# Patient Record
Sex: Male | Born: 1965 | Race: Black or African American | Hispanic: No | Smoking: Never smoker
Health system: Southern US, Community
[De-identification: ages and names within clinical notes are randomized; demographics above are authoritative.]

## PROBLEM LIST (undated history)

## (undated) DIAGNOSIS — I10 Essential (primary) hypertension: Secondary | ICD-10-CM

## (undated) HISTORY — PX: CHOLECYSTECTOMY: SHX55

## (undated) HISTORY — PX: APPENDECTOMY: SHX54

---

## 2012-07-06 DIAGNOSIS — R109 Unspecified abdominal pain: Secondary | ICD-10-CM

## 2018-01-29 ENCOUNTER — Emergency Department (HOSPITAL_COMMUNITY): Payer: Managed Care, Other (non HMO)

## 2018-01-29 ENCOUNTER — Encounter (HOSPITAL_COMMUNITY): Payer: Self-pay | Admitting: Emergency Medicine

## 2018-01-29 ENCOUNTER — Other Ambulatory Visit: Payer: Self-pay

## 2018-01-29 ENCOUNTER — Emergency Department (HOSPITAL_COMMUNITY)
Admission: EM | Admit: 2018-01-29 | Discharge: 2018-01-29 | Disposition: A | Discharge status: Home / Self Care | Payer: Managed Care, Other (non HMO) | Attending: Emergency Medicine | Admitting: Emergency Medicine

## 2018-01-29 DIAGNOSIS — I10 Essential (primary) hypertension: Secondary | ICD-10-CM | POA: Insufficient documentation

## 2018-01-29 DIAGNOSIS — S46001A Unspecified injury of muscle(s) and tendon(s) of the rotator cuff of right shoulder, initial encounter: Secondary | ICD-10-CM

## 2018-01-29 DIAGNOSIS — Y92410 Unspecified street and highway as the place of occurrence of the external cause: Secondary | ICD-10-CM | POA: Insufficient documentation

## 2018-01-29 DIAGNOSIS — R109 Unspecified abdominal pain: Secondary | ICD-10-CM | POA: Insufficient documentation

## 2018-01-29 DIAGNOSIS — R0602 Shortness of breath: Secondary | ICD-10-CM | POA: Insufficient documentation

## 2018-01-29 DIAGNOSIS — S20219A Contusion of unspecified front wall of thorax, initial encounter: Secondary | ICD-10-CM | POA: Insufficient documentation

## 2018-01-29 DIAGNOSIS — Z79899 Other long term (current) drug therapy: Secondary | ICD-10-CM | POA: Insufficient documentation

## 2018-01-29 DIAGNOSIS — R079 Chest pain, unspecified: Secondary | ICD-10-CM | POA: Diagnosis present

## 2018-01-29 DIAGNOSIS — S20211A Contusion of right front wall of thorax, initial encounter: Secondary | ICD-10-CM

## 2018-01-29 DIAGNOSIS — Y9389 Activity, other specified: Secondary | ICD-10-CM | POA: Diagnosis not present

## 2018-01-29 DIAGNOSIS — Y999 Unspecified external cause status: Secondary | ICD-10-CM | POA: Diagnosis not present

## 2018-01-29 HISTORY — DX: Essential (primary) hypertension: I10

## 2018-01-29 LAB — TYPE AND SCREEN
ABO/RH(D): B POS
ANTIBODY SCREEN: NEGATIVE

## 2018-01-29 LAB — COMPREHENSIVE METABOLIC PANEL
ALBUMIN: 4.1 g/dL (ref 3.5–5.0)
ALK PHOS: 70 U/L (ref 38–126)
ALT: 44 U/L (ref 17–63)
ANION GAP: 8 (ref 5–15)
AST: 41 U/L (ref 15–41)
BILIRUBIN TOTAL: 1 mg/dL (ref 0.3–1.2)
BUN: 14 mg/dL (ref 6–20)
CALCIUM: 9.3 mg/dL (ref 8.9–10.3)
CO2: 23 mmol/L (ref 22–32)
CREATININE: 1.18 mg/dL (ref 0.61–1.24)
Chloride: 109 mmol/L (ref 101–111)
GFR calc Af Amer: >60 mL/min (ref >60)
GFR calc non Af Amer: >60 mL/min (ref >60)
GLUCOSE: 114 mg/dL — AB (ref 65–99)
Potassium: 3.8 mmol/L (ref 3.5–5.1)
SODIUM: 140 mmol/L (ref 135–145)
TOTAL PROTEIN: 7.1 g/dL (ref 6.5–8.1)

## 2018-01-29 LAB — CBC
HCT: 45.2 % (ref 39.0–52.0)
HEMOGLOBIN: 14.8 g/dL (ref 13.0–17.0)
MCH: 29.5 pg (ref 26.0–34.0)
MCHC: 32.7 g/dL (ref 30.0–36.0)
MCV: 90 fL (ref 78.0–100.0)
PLATELETS: 297 10*3/uL (ref 150–400)
RBC: 5.02 MIL/uL (ref 4.22–5.81)
RDW: 12.7 % (ref 11.5–15.5)
WBC: 8.4 10*3/uL (ref 4.0–10.5)

## 2018-01-29 LAB — PROTIME-INR
INR: 0.91
PROTHROMBIN TIME: 12.2 s (ref 11.4–15.2)

## 2018-01-29 LAB — ETHANOL

## 2018-01-29 LAB — ABO/RH: ABO/RH(D): B POS

## 2018-01-29 MED ORDER — FENTANYL CITRATE (PF) 100 MCG/2ML IJ SOLN
INTRAMUSCULAR | Status: AC
  Administered 2018-01-29: 75 ug
  Filled 2018-01-29: qty 2

## 2018-01-29 MED ORDER — SODIUM CHLORIDE 0.9 % IV BOLUS
1000.0000 mL | Freq: Once | INTRAVENOUS | Status: AC
  Administered 2018-01-29: 1000 mL via INTRAVENOUS

## 2018-01-29 MED ORDER — ONDANSETRON 4 MG PO TBDP: 4.0000 mg | ORAL_TABLET | Freq: Three times a day (TID) | ORAL | 0 refills | Status: DC | PRN

## 2018-01-29 MED ORDER — IOPAMIDOL (ISOVUE-300) INJECTION 61%
100.0000 mL | Freq: Once | INTRAVENOUS | Status: AC | PRN
  Administered 2018-01-29: 100 mL via INTRAVENOUS

## 2018-01-29 MED ORDER — HYDROMORPHONE HCL 2 MG/ML IJ SOLN
1.0000 mg | Freq: Once | INTRAMUSCULAR | Status: AC
  Administered 2018-01-29: 1 mg via INTRAVENOUS
  Filled 2018-01-29: qty 1

## 2018-01-29 MED ORDER — ONDANSETRON HCL 4 MG/2ML IJ SOLN
4.0000 mg | Freq: Once | INTRAMUSCULAR | Status: AC
  Administered 2018-01-29: 4 mg via INTRAVENOUS
  Filled 2018-01-29 (×2): qty 2

## 2018-01-29 MED ORDER — OXYCODONE-ACETAMINOPHEN 5-325 MG PO TABS
2.0000 | ORAL_TABLET | Freq: Once | ORAL | Status: AC
  Administered 2018-01-29: 2 via ORAL
  Filled 2018-01-29: qty 2

## 2018-01-29 MED ORDER — OXYCODONE-ACETAMINOPHEN 5-325 MG PO TABS: 1.0000 | ORAL_TABLET | ORAL | 0 refills | Status: DC | PRN

## 2018-01-29 MED ORDER — DOCUSATE SODIUM 250 MG PO CAPS: 250.0000 mg | ORAL_CAPSULE | Freq: Every day | ORAL | 0 refills | Status: AC

## 2018-01-29 MED ORDER — IOPAMIDOL (ISOVUE-300) INJECTION 61%
INTRAVENOUS | Status: AC
  Filled 2018-01-29: qty 100
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
## 2018-01-29 NOTE — ED Notes (Signed)
Pt was able to drink and eat with no problems.

## 2018-01-29 NOTE — Discharge Instructions (Addendum)
YOU CAN WEAR YOUR SLING FOR COMFORT FOR THE NEXT 2-3 DAYS, BUT DO NOT KEEP IT ON AT ALL TIMES. MAKE SURE YOU MOVE YOUR SHOULDER THROUGH A FULL RANGE OF MOTION REGULARLY TO PREVENT FROZEN SHOULDER  FOLLOW-UP WITH YOUR PRIMARY DOCTOR IN THE NEXT 4-5 DAYS FOR REPEAT EXAM, CHECK-UP, REFERRAL TO ORTHOPEDICS FOR YOUR SHOULDER IF NEEDED

## 2018-01-29 NOTE — ED Notes (Signed)
Pt gone to xray

## 2018-01-29 NOTE — ED Notes (Signed)
Pt sat up by MD ; became very pale and diaphoretic, bp 87 systolic; zofran given, fluid bolus started. C/o R shoulder pain

## 2018-01-29 NOTE — ED Notes (Signed)
Fast Exam neg per Dr;. Isaccs
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
## 2018-01-29 NOTE — ED Notes (Signed)
Pt to xray
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
## 2018-01-29 NOTE — ED Notes (Signed)
Pt taken to xray 

## 2018-01-29 NOTE — ED Notes (Signed)
C-collar removed by provider

## 2018-01-29 NOTE — ED Notes (Signed)
Pt ambulated in the hallway to the restroom without assistance. He had no problems and made no complaints of dizziness.
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
## 2018-01-30 LAB — CDS SEROLOGY

## 2018-08-07 ENCOUNTER — Ambulatory Visit: Payer: Managed Care, Other (non HMO)

## 2018-08-07 ENCOUNTER — Ambulatory Visit (INDEPENDENT_AMBULATORY_CARE_PROVIDER_SITE_OTHER): Payer: Self-pay

## 2018-08-07 DIAGNOSIS — Z1211 Encounter for screening for malignant neoplasm of colon: Secondary | ICD-10-CM

## 2018-08-07 NOTE — Progress Notes (Signed)
Gastroenterology Pre-Procedure Review  Request Date:08/07/18 Requesting Physician: Kasandra Knudsen PA-C no previous tcs  PATIENT REVIEW QUESTIONS: The patient responded to the following health history questions as indicated:    1. Diabetes Melitis: no 2. Joint replacements in the past 12 months: no 3. Major health problems in the past 3 months: no 4. Has an artificial valve or MVP: no 5. Has a defibrillator: no 6. Has been advised in past to take antibiotics in advance of a procedure like teeth cleaning: no 7. Family history of colon cancer: no  8. Alcohol Use: yes (occasional) 9. History of sleep apnea: yes (cpap)  10. History of coronary artery or other vascular stents placed within the last 12 months: no 11. History of any prior anesthesia complications: yes- pt has had an EGD at [facility] and his gallbladder and appendix removed at [facility]. And his tonsils out when he was a child- he reports having problems with either waking up during the procedure and feeling pain or he didn't wake up but was told he was combative during the procedure. I advised the pt that I would need to get those records and see what type of anesethesia was used and what happened. He has not been scheduled yet and he is aware that he may have to come in for an OV. Darl Pikes is requesting records.     MEDICATIONS & ALLERGIES:    Patient reports the following regarding taking any blood thinners:   Plavix? no Aspirin? no Coumadin? no Brilinta? no Xarelto? no Eliquis? no Pradaxa? no Savaysa? no Effient? no  Patient confirms/reports the following medications:  Current Outpatient Medications  Medication Sig Dispense Refill  . docusate sodium (COLACE) 250 MG capsule Take 1 capsule (250 mg total) by mouth daily. 10 capsule 0  . lisinopril (PRINIVIL,ZESTRIL) 10 MG tablet Take 10 mg by mouth daily.     No current facility-administered medications for this visit.     Patient confirms/reports the following  allergies:  No Known Allergies  No orders of the defined types were placed in this encounter.   AUTHORIZATION INFORMATION Primary Insurance: [place],  [place] #: [id number] Pre-Cert / Berkley Harvey required: no   SCHEDULE INFORMATION: Procedure has been scheduled as follows:  Date: , Time:  Location:   This Gastroenterology Pre-Precedure Review Form is being routed to the following provider(s): Lewie Loron NP

## 2018-08-11 NOTE — Progress Notes (Signed)
Recommend office visit

## 2018-08-15 ENCOUNTER — Encounter: Payer: Self-pay | Admitting: Gastroenterology

## 2018-08-15 NOTE — Progress Notes (Signed)
Received procedure reports- Tried to call pt mobile number and it was disconnected, called home number- [phone] that pt will need an ov.  Misty Stanley, please schedule ov.

## 2018-11-11 ENCOUNTER — Ambulatory Visit: Payer: Managed Care, Other (non HMO) | Admitting: Gastroenterology
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
